# Patient Record
Sex: Male | Born: 1980 | Race: White | Hispanic: No | Marital: Married | Smoking: Never smoker
Health system: Southern US, Community
[De-identification: ages and names within clinical notes are randomized; demographics above are authoritative.]

---

## 2016-09-30 ENCOUNTER — Emergency Department (HOSPITAL_COMMUNITY)
Admission: EM | Admit: 2016-09-30 | Discharge: 2016-09-30 | Disposition: A | Discharge status: Home / Self Care | Payer: No Typology Code available for payment source | Attending: Emergency Medicine | Admitting: Emergency Medicine

## 2016-09-30 ENCOUNTER — Encounter (HOSPITAL_COMMUNITY): Payer: Self-pay

## 2016-09-30 ENCOUNTER — Emergency Department (HOSPITAL_COMMUNITY): Payer: No Typology Code available for payment source

## 2016-09-30 DIAGNOSIS — Y92410 Unspecified street and highway as the place of occurrence of the external cause: Secondary | ICD-10-CM | POA: Insufficient documentation

## 2016-09-30 DIAGNOSIS — S0990XA Unspecified injury of head, initial encounter: Secondary | ICD-10-CM | POA: Insufficient documentation

## 2016-09-30 DIAGNOSIS — Y939 Activity, unspecified: Secondary | ICD-10-CM | POA: Insufficient documentation

## 2016-09-30 DIAGNOSIS — R0789 Other chest pain: Secondary | ICD-10-CM | POA: Insufficient documentation

## 2016-09-30 DIAGNOSIS — Y999 Unspecified external cause status: Secondary | ICD-10-CM | POA: Insufficient documentation

## 2016-09-30 MED ORDER — KETOROLAC TROMETHAMINE 60 MG/2ML IM SOLN
60.0000 mg | Freq: Once | INTRAMUSCULAR | Status: AC
  Administered 2016-09-30: 60 mg via INTRAMUSCULAR
  Filled 2016-09-30: qty 2

## 2016-09-30 MED ORDER — HYDROCODONE-ACETAMINOPHEN 5-325 MG PO TABS: 1.0000 | ORAL_TABLET | ORAL | 0 refills | Status: AC | PRN

## 2016-09-30 MED ORDER — IBUPROFEN 600 MG PO TABS: 600.0000 mg | ORAL_TABLET | Freq: Three times a day (TID) | ORAL | 0 refills | Status: AC | PRN
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
## 2016-09-30 NOTE — ED Notes (Signed)
Patient d/c'd from continuous pulse oximetry and blood pressure cuff; waiting for discharge paperwork
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
